# Patient Record
Sex: Female | Born: 1970
Health system: Southern US, Community
[De-identification: ages and names within clinical notes are randomized; demographics above are authoritative.]

## PROBLEM LIST (undated history)

## (undated) DIAGNOSIS — J45909 Unspecified asthma, uncomplicated: Secondary | ICD-10-CM

## (undated) DIAGNOSIS — Z22322 Carrier or suspected carrier of Methicillin resistant Staphylococcus aureus: Secondary | ICD-10-CM

## (undated) DIAGNOSIS — K219 Gastro-esophageal reflux disease without esophagitis: Secondary | ICD-10-CM

## (undated) HISTORY — PX: ABLATION: SHX5711

## (undated) HISTORY — DX: Carrier or suspected carrier of methicillin resistant Staphylococcus aureus: Z22.322

## (undated) HISTORY — PX: TUBAL LIGATION: SHX77

## (undated) HISTORY — DX: Unspecified asthma, uncomplicated: J45.909

## (undated) HISTORY — DX: Gastro-esophageal reflux disease without esophagitis: K21.9

---

## 1998-02-19 ENCOUNTER — Other Ambulatory Visit: Admission: RE | Admit: 1998-02-19 | Discharge: 1998-02-19 | Payer: Self-pay | Admitting: *Deleted

## 1998-03-21 ENCOUNTER — Other Ambulatory Visit: Admission: RE | Admit: 1998-03-21 | Discharge: 1998-03-21 | Payer: Self-pay | Admitting: *Deleted

## 1998-12-23 ENCOUNTER — Encounter: Payer: Self-pay | Admitting: Vascular Surgery

## 1998-12-27 ENCOUNTER — Ambulatory Visit (HOSPITAL_COMMUNITY): Admission: RE | Admit: 1998-12-27 | Discharge: 1998-12-27 | Payer: Self-pay | Admitting: Vascular Surgery

## 1999-03-26 ENCOUNTER — Other Ambulatory Visit: Admission: RE | Admit: 1999-03-26 | Discharge: 1999-03-26 | Payer: Self-pay | Admitting: *Deleted

## 2000-04-21 ENCOUNTER — Other Ambulatory Visit: Admission: RE | Admit: 2000-04-21 | Discharge: 2000-04-21 | Payer: Self-pay | Admitting: *Deleted

## 2000-08-27 ENCOUNTER — Encounter: Admission: RE | Admit: 2000-08-27 | Discharge: 2000-08-27 | Payer: Self-pay | Admitting: *Deleted

## 2000-08-27 ENCOUNTER — Encounter: Payer: Self-pay | Admitting: *Deleted

## 2001-11-24 ENCOUNTER — Other Ambulatory Visit: Admission: RE | Admit: 2001-11-24 | Discharge: 2001-11-24 | Payer: Self-pay | Admitting: Obstetrics and Gynecology

## 2002-01-17 ENCOUNTER — Encounter: Payer: Self-pay | Admitting: Vascular Surgery

## 2002-01-20 ENCOUNTER — Ambulatory Visit (HOSPITAL_COMMUNITY): Admission: RE | Admit: 2002-01-20 | Discharge: 2002-01-20 | Payer: Self-pay | Admitting: Vascular Surgery

## 2002-01-20 ENCOUNTER — Encounter (INDEPENDENT_AMBULATORY_CARE_PROVIDER_SITE_OTHER): Payer: Self-pay | Admitting: *Deleted

## 2003-05-01 ENCOUNTER — Other Ambulatory Visit: Admission: RE | Admit: 2003-05-01 | Discharge: 2003-05-01 | Payer: Self-pay | Admitting: *Deleted

## 2004-05-26 ENCOUNTER — Other Ambulatory Visit: Admission: RE | Admit: 2004-05-26 | Discharge: 2004-05-26 | Payer: Self-pay | Admitting: Obstetrics and Gynecology

## 2005-07-27 ENCOUNTER — Other Ambulatory Visit: Admission: RE | Admit: 2005-07-27 | Discharge: 2005-07-27 | Payer: Self-pay | Admitting: Obstetrics and Gynecology

## 2007-01-12 ENCOUNTER — Ambulatory Visit (HOSPITAL_BASED_OUTPATIENT_CLINIC_OR_DEPARTMENT_OTHER): Admission: RE | Admit: 2007-01-12 | Discharge: 2007-01-12 | Payer: Self-pay | Admitting: Urology

## 2007-01-12 ENCOUNTER — Encounter (INDEPENDENT_AMBULATORY_CARE_PROVIDER_SITE_OTHER): Payer: Self-pay | Admitting: *Deleted

## 2007-06-07 ENCOUNTER — Encounter: Admission: RE | Admit: 2007-06-07 | Discharge: 2007-06-07 | Payer: Self-pay | Admitting: Obstetrics and Gynecology

## 2007-09-01 ENCOUNTER — Ambulatory Visit (HOSPITAL_COMMUNITY): Admission: RE | Admit: 2007-09-01 | Discharge: 2007-09-01 | Payer: Self-pay | Admitting: Obstetrics and Gynecology

## 2007-09-01 ENCOUNTER — Encounter (INDEPENDENT_AMBULATORY_CARE_PROVIDER_SITE_OTHER): Payer: Self-pay | Admitting: Obstetrics and Gynecology

## 2011-02-10 NOTE — Op Note (Signed)
NAME:  Kelsey Sellers, Kelsey Sellers             ACCOUNT NO.:  [id number]   MEDICAL RECORD NO.:  [id number]          PATIENT TYPE:  AMB   LOCATION:  SDC                           FACILITY:  [facility]   PHYSICIAN:  Michelle L. Grewal, M.D.DATE OF BIRTH:  1971-04-10   DATE OF PROCEDURE:  09/01/2007  DATE OF DISCHARGE:                               OPERATIVE REPORT   PREOPERATIVE DIAGNOSIS:  Menorrhagia and congenital hypertrophy of  bilateral labia.   POSTOPERATIVE DIAGNOSIS:  Menorrhagia and congenital hypertrophy of  bilateral labia.   PROCEDURE:  D&C, hysteroscopy, ThermaChoice endometrial ablation, and  bilateral labial reduction.   SURGEON:  Michelle L. Vincente Poli, M.D.   ANESTHESIA:  MAC with local.   FINDINGS:  Bilaterally enlarged labia.   SPECIMENS:  Pathology - uterine curetting.   ESTIMATED BLOOD LOSS:  Minimal.   COMPLICATIONS:  None.   DESCRIPTION OF PROCEDURE:  The patient was taken to the operating room.  Her anesthesia was administered.  She was prepped and draped in the  usual sterile fashion.  In-and-out catheter was emptied.  Speculum was  inserted into the vagina.  The cervix was grasped with a tenaculum.  The  cervical internal os was gently dilated using Pratt dilators.  The  hysteroscope was inserted into the uterine cavity and excellent  visualization noted a normal cavity.  The hysteroscope was removed.  A  uterine curet was inserted and a sharp uterine curettage was performed.  All curettings were sent to pathology.  We then inserted a Thermachoice-  3 balloon and performed an endometrial ablation for eight minutes  according to the manufacturer's specifications.  The intact balloon was  removed after the 8-minute cycle.  All instruments were removed from the  vagina.  We then turned our attention to the labor.  Examination under  anesthesia revealed both of her labia were quite enlarged and the edges  of the skin from 3 o'clock all the way around to 9 o'clock were  thickened and hyperpigmented consistent with chronic irritation.  I then  used a 15 blade and basically made a linear incision down from 3 o'clock  down to the posterior fourchette and did this likewise on both sides and  removed all redundant skin.  We then closed using a subcuticular using 3-  0 Vicryl  and nicely reapproximated the skin.  At the end of this, hemostasis was  excellent.  She also had very symmetric labia that was flush with her  skin.  Estrace cream was applied to the incision.  The patient will be  given an ice pack.  She was then taken to PACU in stable condition.  All  needle, sponge, and instrument counts correct x2.      Michelle L. Vincente Poli, M.D.  Electronically Signed     MLG/MEDQ  D:  09/01/2007  T:  09/01/2007  Job:  [id number]

## 2011-07-06 LAB — CBC
HCT: 42.7
Hemoglobin: 14.7
MCHC: 34.5
MCV: 96.4
Platelets: 266
RBC: 4.43
RDW: 12.8
WBC: 8.5

## 2011-07-06 LAB — PREGNANCY, URINE: Preg Test, Ur: NEGATIVE

## 2011-10-17 ENCOUNTER — Encounter (HOSPITAL_COMMUNITY): Payer: Self-pay | Admitting: Pharmacist

## 2011-10-26 ENCOUNTER — Other Ambulatory Visit (HOSPITAL_COMMUNITY): Payer: Self-pay

## 2011-10-27 ENCOUNTER — Other Ambulatory Visit: Payer: Self-pay | Admitting: Emergency Medicine

## 2011-10-27 ENCOUNTER — Ambulatory Visit
Admission: RE | Admit: 2011-10-27 | Discharge: 2011-10-27 | Disposition: A | Discharge status: Home / Self Care | Payer: Managed Care, Other (non HMO) | Source: Ambulatory Visit | Attending: Emergency Medicine | Admitting: Emergency Medicine

## 2011-10-27 DIAGNOSIS — R05 Cough: Secondary | ICD-10-CM

## 2011-10-29 ENCOUNTER — Encounter (HOSPITAL_COMMUNITY): Admission: RE | Payer: Self-pay | Source: Ambulatory Visit

## 2011-10-29 ENCOUNTER — Ambulatory Visit (HOSPITAL_COMMUNITY): Admission: RE | Admit: 2011-10-29 | Payer: Self-pay | Source: Ambulatory Visit | Admitting: Obstetrics and Gynecology

## 2011-10-29 SURGERY — LABIAPLASTY, VULVA
Anesthesia: Choice | Laterality: Bilateral

## 2012-02-18 ENCOUNTER — Ambulatory Visit (HOSPITAL_COMMUNITY)
Admission: RE | Admit: 2012-02-18 | Discharge: 2012-02-18 | Disposition: A | Discharge status: Home / Self Care | Payer: Managed Care, Other (non HMO) | Source: Ambulatory Visit | Attending: Obstetrics and Gynecology | Admitting: Obstetrics and Gynecology

## 2012-02-18 ENCOUNTER — Other Ambulatory Visit (HOSPITAL_COMMUNITY): Payer: Self-pay | Admitting: Obstetrics and Gynecology

## 2012-02-18 DIAGNOSIS — R109 Unspecified abdominal pain: Secondary | ICD-10-CM

## 2012-02-18 MED ORDER — IOHEXOL 300 MG/ML  SOLN
100.0000 mL | Freq: Once | INTRAMUSCULAR | Status: AC | PRN
  Administered 2012-02-18: 100 mL via INTRAVENOUS

## 2013-07-03 ENCOUNTER — Other Ambulatory Visit: Payer: Self-pay | Admitting: Obstetrics and Gynecology

## 2014-05-23 ENCOUNTER — Other Ambulatory Visit: Payer: Self-pay | Admitting: Family Medicine

## 2014-05-23 ENCOUNTER — Ambulatory Visit
Admission: RE | Admit: 2014-05-23 | Discharge: 2014-05-23 | Disposition: A | Discharge status: Home / Self Care | Payer: Managed Care, Other (non HMO) | Source: Ambulatory Visit | Attending: Family Medicine | Admitting: Family Medicine

## 2014-05-23 DIAGNOSIS — R9389 Abnormal findings on diagnostic imaging of other specified body structures: Secondary | ICD-10-CM

## 2014-05-23 DIAGNOSIS — R0602 Shortness of breath: Secondary | ICD-10-CM

## 2014-11-29 ENCOUNTER — Other Ambulatory Visit: Payer: Self-pay | Admitting: Obstetrics and Gynecology

## 2014-11-30 LAB — CYTOLOGY - PAP

## 2014-12-04 ENCOUNTER — Other Ambulatory Visit: Payer: Self-pay | Admitting: Obstetrics and Gynecology

## 2014-12-04 DIAGNOSIS — R928 Other abnormal and inconclusive findings on diagnostic imaging of breast: Secondary | ICD-10-CM

## 2014-12-10 ENCOUNTER — Ambulatory Visit
Admission: RE | Admit: 2014-12-10 | Discharge: 2014-12-10 | Disposition: A | Discharge status: Home / Self Care | Payer: Managed Care, Other (non HMO) | Source: Ambulatory Visit | Attending: Obstetrics and Gynecology | Admitting: Obstetrics and Gynecology

## 2014-12-10 DIAGNOSIS — R928 Other abnormal and inconclusive findings on diagnostic imaging of breast: Secondary | ICD-10-CM

## 2015-09-03 ENCOUNTER — Encounter: Payer: Self-pay | Admitting: Pulmonary Disease

## 2015-09-03 ENCOUNTER — Ambulatory Visit (INDEPENDENT_AMBULATORY_CARE_PROVIDER_SITE_OTHER): Payer: Managed Care, Other (non HMO) | Admitting: Pulmonary Disease

## 2015-09-03 VITALS — BP 118/64 | HR 53 | Ht 64.0 in | Wt 134.6 lb

## 2015-09-03 DIAGNOSIS — R0600 Dyspnea, unspecified: Secondary | ICD-10-CM | POA: Diagnosis not present

## 2015-09-03 NOTE — Patient Instructions (Signed)
We will schedule you for lung function tests and a methacholine challenge test He is using the ventolin inhaler as prescribed  Return to clinic after these tests in 1 month

## 2015-09-03 NOTE — Progress Notes (Signed)
   Subjective:    Patient ID: Kelsey Sellers, female    DOB: 07/28/1971, 44 y.o.   MRN: [id number]  HPI Consult for evaluation of asthma.  Kelsey Sellers is a 44 year old with past medical history of asthma, allergies. She has episodes of bronchitis about once every year. She was diagnosed with asthma by her primary care physician and was given albuterol inhaler.she has history of dyspnea on exertion. She gets short of breath on climbing 2-3 flights of stairs. She may does maintain an active lifestyle with regular exercise and walking. Her shortness of breath isng. She does not have any cough or sputum production. She has allergic symptoms throughout the year with rhinitis and postnasal drip. She was given a PPI for acid reflux but denies any symptoms of heartburn.She is given albuterol inhaler by her primary care physician. She uses it about 2-3 times a week. She says that it does relieve her symptoms of chest tightness and dyspnea.  She was sick a few months ago while traveling back from [place]. She was seen at urgent care and given a diagnosis of diverticulitis. She was treated with levofloxacin and Flagyl.She had a chest x-ray at that time. I reviewed these images. There is no evidence of any consolidation or infiltrate. However there is a suggestion of hyper inflation.  She is an ex-smoker. Quit when she was 44 years old. 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  Past Medical History  Diagnosis Date  . Asthma     Current outpatient prescriptions:  .  albuterol (PROVENTIL HFA;VENTOLIN HFA) 108 (90 BASE) MCG/ACT inhaler, Inhale 1 puff into the lungs every 6 (six) hours as needed for wheezing or shortness of breath., Disp: , Rfl:  .  cetirizine (ZYRTEC ALLERGY) 10 MG tablet, Take 10 mg by mouth daily., Disp: , Rfl:   Review of Systems Dyspnea on exertion, occasional wheezing. No cough, sputum production, hemoptysis. Denies any  fevers, chills,malaise, fatigue, loss of weight or appetite. Denies any chest pain, palpitations. Denies any nausea, vomiting, diarrhea, constipation. All other review of systems are negative     Objective:   Physical Exam  Blood pressure 118/64, pulse 53, height 5\' 4"  (1.626 m), weight 134 lb 9.6 oz (61.054 kg), SpO2 99 %. Gen: No apparent distress Neuro: No gross focal deficits. Neck: No JVD, lymphadenopathy, thyromegaly. RS: Clear, no wheeze or crackles CVS: S1-S2 heard, no murmurs rubs gallops. Abdomen: Soft, positive bowel sounds. Extremities: No edema.    Assessment & Plan:  Dyspnea on exertion with wheezing  Symptoms are consistent with mild persistent asthma. She does respond to albuterol inhaler and has allergic symptoms.I asked her to continue the albuterol inhaler as needed. I will also get a of PFTs with bronchodilator response and a methacholine challenge test for further evaluation. She may need to be on an additional controller medication.  Chilton GreathousePraveen Molly Maselli MD [facility] Pager [phone] If no answer or after 3pm call: [phone] 09/03/2015, 5:16 PM

## 2015-09-26 ENCOUNTER — Encounter (HOSPITAL_COMMUNITY): Payer: Managed Care, Other (non HMO)

## 2015-09-27 ENCOUNTER — Ambulatory Visit (HOSPITAL_COMMUNITY)
Admission: RE | Admit: 2015-09-27 | Discharge: 2015-09-27 | Disposition: A | Discharge status: Home / Self Care | Payer: Managed Care, Other (non HMO) | Source: Ambulatory Visit | Attending: Pulmonary Disease | Admitting: Pulmonary Disease

## 2015-09-27 DIAGNOSIS — R0600 Dyspnea, unspecified: Secondary | ICD-10-CM | POA: Diagnosis present

## 2015-09-27 LAB — PULMONARY FUNCTION TEST
FEF 25-75 PRE: 3.07 L/s
FEF 25-75 Post: 1.57 L/sec
FEF2575-%Change-Post: -48 %
FEF2575-%PRED-POST: 52 %
FEF2575-%Pred-Pre: 101 %
FEV1-%CHANGE-POST: -33 %
FEV1-%Pred-Post: 67 %
FEV1-%Pred-Pre: 102 %
FEV1-POST: 2.01 L
FEV1-PRE: 3.02 L
FEV1FVC-%Change-Post: -28 %
FEV1FVC-%PRED-PRE: 99 %
FEV6-%Change-Post: -7 %
FEV6-%PRED-POST: 96 %
FEV6-%PRED-PRE: 103 %
FEV6-POST: 3.44 L
FEV6-PRE: 3.7 L
FEV6FVC-%PRED-POST: 102 %
FEV6FVC-%PRED-PRE: 102 %
FVC-%CHANGE-POST: -7 %
FVC-%PRED-PRE: 101 %
FVC-%Pred-Post: 93 %
FVC-PRE: 3.7 L
FVC-Post: 3.44 L
POST FEV6/FVC RATIO: 100 %
PRE FEV6/FVC RATIO: 100 %
Post FEV1/FVC ratio: 59 %
Pre FEV1/FVC ratio: 82 %

## 2015-09-27 MED ORDER — METHACHOLINE 0.0625 MG/ML NEB SOLN
2.0000 mL | Freq: Once | RESPIRATORY_TRACT | Status: AC
  Administered 2015-09-27: 0.125 mg via RESPIRATORY_TRACT

## 2015-09-27 MED ORDER — METHACHOLINE 4 MG/ML NEB SOLN: 2.0000 mL | Freq: Once | RESPIRATORY_TRACT | Status: DC

## 2015-09-27 MED ORDER — ALBUTEROL SULFATE (2.5 MG/3ML) 0.083% IN NEBU
2.5000 mg | INHALATION_SOLUTION | Freq: Once | RESPIRATORY_TRACT | Status: AC
  Administered 2015-09-27: 2.5 mg via RESPIRATORY_TRACT

## 2015-09-27 MED ORDER — SODIUM CHLORIDE 0.9 % IN NEBU
3.0000 mL | INHALATION_SOLUTION | Freq: Once | RESPIRATORY_TRACT | Status: AC
  Administered 2015-09-27: 3 mL via RESPIRATORY_TRACT

## 2015-09-27 MED ORDER — METHACHOLINE 1 MG/ML NEB SOLN: 2.0000 mL | Freq: Once | RESPIRATORY_TRACT | Status: DC

## 2015-09-27 MED ORDER — METHACHOLINE 0.25 MG/ML NEB SOLN
2.0000 mL | Freq: Once | RESPIRATORY_TRACT | Status: AC
  Administered 2015-09-27: 0.5 mg via RESPIRATORY_TRACT

## 2015-09-27 MED ORDER — METHACHOLINE 16 MG/ML NEB SOLN: 2.0000 mL | Freq: Once | RESPIRATORY_TRACT | Status: DC

## 2015-10-01 ENCOUNTER — Ambulatory Visit (INDEPENDENT_AMBULATORY_CARE_PROVIDER_SITE_OTHER): Payer: Managed Care, Other (non HMO) | Admitting: Adult Health

## 2015-10-01 ENCOUNTER — Telehealth: Payer: Self-pay | Admitting: Pulmonary Disease

## 2015-10-01 ENCOUNTER — Encounter: Payer: Self-pay | Admitting: Adult Health

## 2015-10-01 ENCOUNTER — Ambulatory Visit (INDEPENDENT_AMBULATORY_CARE_PROVIDER_SITE_OTHER)
Admission: RE | Admit: 2015-10-01 | Discharge: 2015-10-01 | Disposition: A | Discharge status: Home / Self Care | Payer: Managed Care, Other (non HMO) | Source: Ambulatory Visit | Attending: Adult Health | Admitting: Adult Health

## 2015-10-01 VITALS — BP 102/80 | HR 71 | Temp 98.3°F | Ht 64.0 in | Wt 138.0 lb

## 2015-10-01 DIAGNOSIS — R0989 Other specified symptoms and signs involving the circulatory and respiratory systems: Secondary | ICD-10-CM

## 2015-10-01 DIAGNOSIS — J45901 Unspecified asthma with (acute) exacerbation: Secondary | ICD-10-CM | POA: Insufficient documentation

## 2015-10-01 MED ORDER — PREDNISONE 10 MG PO TABS: ORAL_TABLET | ORAL | Status: DC

## 2015-10-01 MED ORDER — LEVALBUTEROL HCL 0.63 MG/3ML IN NEBU
0.6300 mg | INHALATION_SOLUTION | Freq: Once | RESPIRATORY_TRACT | Status: AC
  Administered 2015-10-01: 0.63 mg via RESPIRATORY_TRACT

## 2015-10-01 NOTE — Progress Notes (Signed)
Quick Note:  Called and spoke with pt. Reviewed results and recs. Pt voiced understanding and had no further questions. ______ 

## 2015-10-01 NOTE — Telephone Encounter (Signed)
error 

## 2015-10-01 NOTE — Progress Notes (Signed)
   Subjective:    Patient ID: Kelsey Sellers, female    DOB: [birth year], 45 y.o.   MRN: [id number]  HPI 45 yo female former smoker seen for pulmonary consult 08/2015 .   10/01/2015 Acute OV  Pt complains of 3 days of chest congestion/tightness, occasional dry cough, sinus congestion, PND, sneezing, nausea and wheezing .  Denies any fever or vomiting, discolored mucus .  Says she has methacholine challenge test on 12/30 , sx started right after that.  She denies any chest pain, orthopnea, PND, leg swelling or orthopnea. Methacholine challenge test results are not available. Has an upcoming pulmonary function test next week.  Was sick over Thanksgiving tx for bronchitis tx w/ several abx  . Took her few weeks to get better. Cough did go entirely away until last 3 days.    Past Medical History  Diagnosis Date  . Asthma    Current Outpatient Prescriptions on File Prior to Visit  Medication Sig Dispense Refill  . albuterol (PROVENTIL HFA;VENTOLIN HFA) 108 (90 BASE) MCG/ACT inhaler Inhale 1 puff into the lungs every 6 (six) hours as needed for wheezing or shortness of breath.    . cetirizine (ZYRTEC ALLERGY) 10 MG tablet Take 10 mg by mouth daily.     No current facility-administered medications on file prior to visit.      Review of Systems Constitutional:   No  weight loss, night sweats,  Fevers, chills, fatigue, or  lassitude.  HEENT:   No headaches,  Difficulty swallowing,  Tooth/dental problems, or  Sore throat,                No sneezing, itching, ear ache,  +nasal congestion, post nasal drip,   CV:  No chest pain,  Orthopnea, PND, swelling in lower extremities, anasarca, dizziness, palpitations, syncope.   GI  No heartburn, indigestion, abdominal pain, nausea, vomiting, diarrhea, change in bowel habits, loss of appetite, bloody stools.   Resp:    No chest wall deformity  Skin: no rash or lesions.  GU: no dysuria, change in color of urine, no urgency or frequency.  No flank  pain, no hematuria   MS:  No joint pain or swelling.  No decreased range of motion.  No back pain.  Psych:  No change in mood or affect. No depression or anxiety.  No memory loss.         Objective:   Physical Exam Filed Vitals:   10/01/15 1036  BP: 102/80  Pulse: 71  Temp: 98.3 F (36.8 C)  TempSrc: Oral  Height: 5\' 4"  (1.626 m)  Weight: 138 lb (62.596 kg)  SpO2: 100%   GEN: A/Ox3; pleasant , NAD, well nourished   HEENT:  [place]/AT,  EACs-clear, TMs-wnl, NOSE-clear drainage THROAT-clear, no lesions, no postnasal drip or exudate noted.   NECK:  Supple w/ fair ROM; no JVD; normal carotid impulses w/o bruits; no thyromegaly or nodules palpated; no lymphadenopathy.  RESP  Clear  P & A; w/o, wheezes/ rales/ or rhonchi.no accessory muscle use, no dullness to percussion  CARD:  RRR, no m/r/g  , no peripheral edema, pulses intact, no cyanosis or clubbing.  GI:   Soft & nt; nml bowel sounds; no organomegaly or masses detected.  Musco: Warm bil, no deformities or joint swelling noted.   Neuro: alert, no focal deficits noted.    Skin: Warm, no lesions or rashes         Assessment & Plan:

## 2015-10-01 NOTE — Patient Instructions (Addendum)
Prednisone taper over next week.  Mucinex DM Twice daily As needed  Cough/congestion .  Saline nasal rinses As needed   Chest xray today  Follow up Dr. Isaiah SergeMannam next week as planned and As needed   Please contact office for sooner follow up if symptoms do not improve or worsen or seek emergency care

## 2015-10-01 NOTE — Assessment & Plan Note (Signed)
Flare w/ URI suspect is viral +/- aggravation from MCT  Hold on abx at this time  Check cxr today   Plan  Prednisone taper over next week.  Mucinex DM Twice daily As needed  Cough/congestion .  Saline nasal rinses As needed   Chest xray today  Follow up Dr. Isaiah SergeMannam next week as planned and As needed   Please contact office for sooner follow up if symptoms do not improve or worsen or seek emergency care

## 2015-10-02 ENCOUNTER — Telehealth: Payer: Self-pay | Admitting: Adult Health

## 2015-10-02 MED ORDER — AZITHROMYCIN 250 MG PO TABS: ORAL_TABLET | ORAL | Status: DC

## 2015-10-02 NOTE — Telephone Encounter (Signed)
[facility] [address]. Called and left message advising patient that [facility]. Nothing further needed.

## 2015-10-02 NOTE — Telephone Encounter (Signed)
Spoke with the patient. [name] pressure and discharge is worse and she had fever and chills overnight. Please order Z pack. 500 mg today and then 250 mg for next 4 days.

## 2015-10-02 NOTE — Telephone Encounter (Signed)
Spoke with pt, c/o sinus congestion, teeth pain, facial pain, HA x4 days.   Pt saw TP yesterday-per these recs was given on pred taper, recs to take mucinex and do sinus rinses, which pt states she's been doing.  Pt requesting in addition to her recs from yesterday. [facility] [address]    PM please advise.  Thanks!

## 2015-10-02 NOTE — Telephone Encounter (Signed)
PM is off this afternoon and message has not been answered.  Sending to DOD for recs. MR please advise on recs.  Thanks.

## 2015-10-07 ENCOUNTER — Ambulatory Visit (HOSPITAL_COMMUNITY): Payer: Managed Care, Other (non HMO)

## 2015-10-07 ENCOUNTER — Ambulatory Visit: Payer: Managed Care, Other (non HMO) | Admitting: Pulmonary Disease

## 2016-05-18 ENCOUNTER — Other Ambulatory Visit: Payer: Self-pay | Admitting: Emergency Medicine

## 2016-05-18 DIAGNOSIS — R1032 Left lower quadrant pain: Secondary | ICD-10-CM

## 2016-05-19 ENCOUNTER — Encounter: Payer: Self-pay | Admitting: Radiology

## 2016-05-19 ENCOUNTER — Ambulatory Visit
Admission: RE | Admit: 2016-05-19 | Discharge: 2016-05-19 | Disposition: A | Discharge status: Home / Self Care | Payer: Managed Care, Other (non HMO) | Source: Ambulatory Visit | Attending: Emergency Medicine | Admitting: Emergency Medicine

## 2016-05-19 DIAGNOSIS — R1032 Left lower quadrant pain: Secondary | ICD-10-CM

## 2016-05-19 MED ORDER — IOPAMIDOL (ISOVUE-300) INJECTION 61%
100.0000 mL | Freq: Once | INTRAVENOUS | Status: AC | PRN
  Administered 2016-05-19: 100 mL via INTRAVENOUS

## 2016-05-20 ENCOUNTER — Encounter: Payer: Self-pay | Admitting: Radiology

## 2017-05-04 DIAGNOSIS — R1013 Epigastric pain: Secondary | ICD-10-CM | POA: Diagnosis present

## 2017-05-04 DIAGNOSIS — K219 Gastro-esophageal reflux disease without esophagitis: Secondary | ICD-10-CM | POA: Diagnosis not present

## 2017-05-04 DIAGNOSIS — Z79899 Other long term (current) drug therapy: Secondary | ICD-10-CM | POA: Diagnosis not present

## 2017-05-04 DIAGNOSIS — J45909 Unspecified asthma, uncomplicated: Secondary | ICD-10-CM | POA: Insufficient documentation

## 2017-05-05 ENCOUNTER — Emergency Department (HOSPITAL_COMMUNITY)
Admission: EM | Admit: 2017-05-05 | Discharge: 2017-05-05 | Disposition: A | Discharge status: Home / Self Care | Payer: 59 | Attending: Emergency Medicine | Admitting: Emergency Medicine

## 2017-05-05 ENCOUNTER — Encounter (HOSPITAL_COMMUNITY): Payer: Self-pay | Admitting: *Deleted

## 2017-05-05 DIAGNOSIS — K219 Gastro-esophageal reflux disease without esophagitis: Secondary | ICD-10-CM

## 2017-05-05 LAB — LIPASE, BLOOD: LIPASE: 45 U/L (ref 11–51)

## 2017-05-05 LAB — COMPREHENSIVE METABOLIC PANEL
ALK PHOS: 41 U/L (ref 38–126)
ALT: 18 U/L (ref 14–54)
AST: 16 U/L (ref 15–41)
Albumin: 4.3 g/dL (ref 3.5–5.0)
Anion gap: 8 (ref 5–15)
BUN: 16 mg/dL (ref 6–20)
CALCIUM: 9.3 mg/dL (ref 8.9–10.3)
CHLORIDE: 104 mmol/L (ref 101–111)
CO2: 25 mmol/L (ref 22–32)
CREATININE: 0.8 mg/dL (ref 0.44–1.00)
Glucose, Bld: 91 mg/dL (ref 65–99)
Potassium: 3.9 mmol/L (ref 3.5–5.1)
SODIUM: 137 mmol/L (ref 135–145)
Total Bilirubin: 0.6 mg/dL (ref 0.3–1.2)
Total Protein: 6.9 g/dL (ref 6.5–8.1)

## 2017-05-05 LAB — URINALYSIS, ROUTINE W REFLEX MICROSCOPIC
BILIRUBIN URINE: NEGATIVE
Bacteria, UA: NONE SEEN
GLUCOSE, UA: NEGATIVE mg/dL
KETONES UR: NEGATIVE mg/dL
LEUKOCYTES UA: NEGATIVE
Nitrite: NEGATIVE
PROTEIN: NEGATIVE mg/dL
Specific Gravity, Urine: 1.005 (ref 1.005–1.030)
Squamous Epithelial / LPF: NONE SEEN
WBC, UA: NONE SEEN WBC/hpf (ref 0–5)
pH: 6 (ref 5.0–8.0)

## 2017-05-05 LAB — CBC
HCT: 43.8 % (ref 36.0–46.0)
Hemoglobin: 14.6 g/dL (ref 12.0–15.0)
MCH: 31.5 pg (ref 26.0–34.0)
MCHC: 33.3 g/dL (ref 30.0–36.0)
MCV: 94.6 fL (ref 78.0–100.0)
PLATELETS: 298 10*3/uL (ref 150–400)
RBC: 4.63 MIL/uL (ref 3.87–5.11)
RDW: 12.7 % (ref 11.5–15.5)
WBC: 8.8 10*3/uL (ref 4.0–10.5)

## 2017-05-05 MED ORDER — OMEPRAZOLE 20 MG PO CPDR: 20.0000 mg | DELAYED_RELEASE_CAPSULE | Freq: Two times a day (BID) | ORAL | 0 refills | Status: DC

## 2017-05-05 MED ORDER — PANTOPRAZOLE SODIUM 40 MG PO TBEC
40.0000 mg | DELAYED_RELEASE_TABLET | Freq: Every day | ORAL | Status: DC
  Administered 2017-05-05: 40 mg via ORAL
  Filled 2017-05-05: qty 1

## 2017-05-05 MED ORDER — ONDANSETRON 4 MG PO TBDP: 4.0000 mg | ORAL_TABLET | Freq: Once | ORAL | Status: DC | PRN

## 2017-05-05 MED ORDER — SUCRALFATE 1 GM/10ML PO SUSP: 1.0000 g | Freq: Three times a day (TID) | ORAL | 0 refills | Status: DC

## 2017-05-05 MED ORDER — SUCRALFATE 1 G PO TABS
1.0000 g | ORAL_TABLET | Freq: Once | ORAL | Status: AC
  Administered 2017-05-05: 1 g via ORAL
  Filled 2017-05-05: qty 1

## 2017-05-05 NOTE — ED Triage Notes (Signed)
Pt c/o intermittent epigastric pain x 1 week with worsening pain tonight and nausea.

## 2017-05-05 NOTE — ED Notes (Signed)
Pt took ranidine without improvement

## 2017-05-05 NOTE — Discharge Instructions (Signed)
Today your labs are all within normal parameters.  You've been started on medication for gastric reflux, even given a prescription for Carafate take this.  He for meals and before bedtime on a regular basis for 1 week, then as needed U been given a second prescription for Prilosec.  Take this medication twice a day, once in the morning once an evening for 7 days and then 1 tablet daily.  Please make an appointment with your primary care physician for follow-up in 7-10 days

## 2017-05-05 NOTE — ED Provider Notes (Signed)
[facility]-EMER[facility]ENCY DEPT Provider Note   CSN: [id number] Arrival date & time: 05/04/17  2245     History   Chief Complaint Chief Complaint  Patient presents with  . Abdominal Pain    HPI Kelsey Sellers is a 46 y.o. female.  This a normally healthy 46 year old female who presents with 1+ weeks of intermittent epigastric pain that occurs shortly after eating will wake her from sleep at night, does radiate into mid chest.  Cannot relate specific foods to exacerbate symptoms. Does have a history of gastritis several months ago after use of ibuprofen.      Past Medical History:  Diagnosis Date  . Asthma     Patient Active Problem List   Diagnosis Date Noted  . Asthmatic bronchitis with acute exacerbation 10/01/2015    Past Surgical History:  Procedure Laterality Date  . ABLATION    . TUBAL LIGATION      OB History    No data available       Home Medications    Prior to Admission medications   Medication Sig Start Date End Date Taking? Authorizing Provider  albuterol (PROVENTIL HFA;VENTOLIN HFA) 108 (90 BASE) MCG/ACT inhaler Inhale 1 puff into the lungs every 6 (six) hours as needed for wheezing or shortness of breath.    [provider]  azithromycin (ZITHROMAX) 250 MG tablet Take 2 tablets today, then 1 tablet daily until gone. 10/02/15   Mannam, Colbert CoyerPraveen, MD  cetirizine (ZYRTEC ALLERGY) 10 MG tablet Take 10 mg by mouth daily.    [provider]  cetirizine (ZYRTEC) 10 MG tablet Take 10 mg by mouth daily as needed. Usually 3 times a week    [provider]  omeprazole (PRILOSEC) 20 MG capsule Take 1 capsule (20 mg total) by mouth 2 times daily at 12 noon and 4 pm. Take 2 tablets daily.  1 in the morning and 1 in the evening for 7 days then 1 tablet daily 05/05/17 06/05/17  Earley FavorSchulz, Cylan Borum, NP  predniSONE (DELTASONE) 10 MG tablet 4 tabs for 2 days, then 3 tabs for 2 days, 2 tabs for 2 days, then 1 tab for 2 days, then stop 10/01/15   Parrett, Tammy S, NP    predniSONE (STERAPRED UNI-PAK) 10 MG tablet Take 10 mg by mouth. dosepak take as directed    [provider]  sucralfate (CARAFATE) 1 GM/10ML suspension Take 10 mLs (1 g total) by mouth 4 (four) times daily -  with meals and at bedtime. Use the Carafate on a regular basis for 1 week, then as needed 05/05/17   Earley FavorSchulz, Nafeesa Dils, NP    Family History Family History  Problem Relation Age of Onset  . Liver disease Mother   . Lung cancer Maternal Grandmother   . Diabetes Maternal Grandmother   . Lung cancer Maternal Grandfather     Social History Social History  Substance Use Topics  . Smoking status: Never Smoker  . Smokeless tobacco: Never Used  . Alcohol use 0.0 oz/week     Comment: rare     Allergies   Iohexol   Review of Systems Review of Systems  Constitutional: Negative for fever.  Respiratory: Negative for shortness of breath.   Cardiovascular: Negative for chest pain.  Gastrointestinal: Positive for abdominal pain. Negative for nausea and vomiting.  All other systems reviewed and are negative.    Physical Exam Updated Vital Signs BP (!) 131/98 (BP Location: Left Arm)   Pulse 72   Temp 98.5 F (  36.9 C) (Oral)   Resp 12   LMP 04/25/2017   SpO2 100%   Physical Exam  Constitutional: She appears well-developed and well-nourished.  HENT:  Head: Normocephalic.  Eyes: Pupils are equal, round, and reactive to light.  Neck: Normal range of motion.  Cardiovascular: Normal rate.   Pulmonary/Chest: Effort normal.  Abdominal: Soft. She exhibits no distension. There is tenderness in the epigastric area.    Skin: Skin is warm.  Psychiatric: She has a normal mood and affect.  Nursing note and vitals reviewed.    ED Treatments / Results  Labs (all labs ordered are listed, but only abnormal results are displayed) Labs Reviewed  URINALYSIS, ROUTINE W REFLEX MICROSCOPIC - Abnormal; Notable for the following:       Result Value   Color, Urine COLORLESS (*)     Hgb urine dipstick SMALL (*)    All other components within normal limits  LIPASE, BLOOD  COMPREHENSIVE METABOLIC PANEL  CBC    EKG  EKG Interpretation None       Radiology No results found.  Procedures Procedures (including critical care time)  Medications Ordered in ED Medications  ondansetron (ZOFRAN-ODT) disintegrating tablet 4 mg (not administered)  pantoprazole (PROTONIX) EC tablet 40 mg (not administered)  sucralfate (CARAFATE) tablet 1 g (1 g Oral Given 05/05/17 0439)     Initial Impression / Assessment and Plan / ED Course  I have reviewed the triage vital signs and the nursing notes.  Pertinent labs & imaging results that were available during my care of the patient were reviewed by me and considered in my medical decision making (see chart for details).      Labs reviewed lipase and LFTs within normal parameters.  Gallbladder disease less likely.  Then, GERD or gastritis.  We'll start patient on Carafate and Prilosec have her follow-up with her PCP.  Final Clinical Impressions(s) / ED Diagnoses   Final diagnoses:  Gastroesophageal reflux disease without esophagitis    New Prescriptions New Prescriptions   OMEPRAZOLE (PRILOSEC) 20 MG CAPSULE    Take 1 capsule (20 mg total) by mouth 2 times daily at 12 noon and 4 pm. Take 2 tablets daily.  1 in the morning and 1 in the evening for 7 days then 1 tablet daily   SUCRALFATE (CARAFATE) 1 GM/10ML SUSPENSION    Take 10 mLs (1 g total) by mouth 4 (four) times daily -  with meals and at bedtime. Use the Carafate on a regular basis for 1 week, then as needed     Earley Favor, NP 05/05/17 0504    Ward, Layla Maw, DO 05/05/17 [phone]

## 2017-05-05 NOTE — ED Notes (Signed)
Offered zofran and percocet; pt declined at present

## 2017-05-19 ENCOUNTER — Other Ambulatory Visit: Payer: Self-pay | Admitting: Physician Assistant

## 2017-05-19 DIAGNOSIS — R110 Nausea: Secondary | ICD-10-CM

## 2017-05-19 DIAGNOSIS — R1013 Epigastric pain: Secondary | ICD-10-CM

## 2017-05-26 ENCOUNTER — Ambulatory Visit
Admission: RE | Admit: 2017-05-26 | Discharge: 2017-05-26 | Disposition: A | Discharge status: Home / Self Care | Payer: No Typology Code available for payment source | Source: Ambulatory Visit | Attending: Physician Assistant | Admitting: Physician Assistant

## 2017-05-26 DIAGNOSIS — R1013 Epigastric pain: Secondary | ICD-10-CM

## 2017-05-26 DIAGNOSIS — R110 Nausea: Secondary | ICD-10-CM

## 2017-05-28 ENCOUNTER — Other Ambulatory Visit: Payer: Self-pay | Admitting: Gastroenterology

## 2017-05-28 DIAGNOSIS — R1013 Epigastric pain: Secondary | ICD-10-CM

## 2017-05-28 DIAGNOSIS — K769 Liver disease, unspecified: Secondary | ICD-10-CM

## 2017-06-06 ENCOUNTER — Inpatient Hospital Stay
Admission: RE | Admit: 2017-06-06 | Discharge: 2017-06-06 | Disposition: A | Discharge status: Home / Self Care | Payer: Self-pay | Source: Ambulatory Visit | Attending: Gastroenterology | Admitting: Gastroenterology

## 2017-06-27 ENCOUNTER — Other Ambulatory Visit: Payer: Self-pay

## 2017-06-29 ENCOUNTER — Telehealth: Payer: Self-pay

## 2017-06-29 NOTE — Telephone Encounter (Signed)
LMOM for patient that I'd phoned in her 13-hour prep [facility] [place], and if the label wasn't clear to give me a call.  She is to take Prednisone  PO 07/09/17 @ 2030, 07/10/17 @ 0230 and 0830.  She is to take Benadryl  PO 07/10/17 @ 0830, as well.  Larina Earthly, RN

## 2017-07-10 ENCOUNTER — Inpatient Hospital Stay: Admission: RE | Admit: 2017-07-10 | Payer: Self-pay | Source: Ambulatory Visit

## 2017-07-18 ENCOUNTER — Inpatient Hospital Stay: Admission: RE | Admit: 2017-07-18 | Payer: Self-pay | Source: Ambulatory Visit

## 2017-08-01 ENCOUNTER — Ambulatory Visit
Admission: RE | Admit: 2017-08-01 | Discharge: 2017-08-01 | Disposition: A | Discharge status: Home / Self Care | Payer: Commercial Managed Care - PPO | Source: Ambulatory Visit | Attending: Gastroenterology | Admitting: Gastroenterology

## 2017-08-01 DIAGNOSIS — R1013 Epigastric pain: Secondary | ICD-10-CM

## 2017-08-01 DIAGNOSIS — K769 Liver disease, unspecified: Secondary | ICD-10-CM

## 2017-08-01 MED ORDER — GADOBENATE DIMEGLUMINE 529 MG/ML IV SOLN
13.0000 mL | Freq: Once | INTRAVENOUS | Status: AC | PRN
  Administered 2017-08-01: 13 mL via INTRAVENOUS

## 2017-08-25 ENCOUNTER — Encounter: Payer: Self-pay | Admitting: Family Medicine

## 2017-08-25 ENCOUNTER — Ambulatory Visit: Payer: 59 | Admitting: Family Medicine

## 2017-08-25 VITALS — BP 120/80 | HR 71 | Temp 97.9°F | Wt 137.4 lb

## 2017-08-25 DIAGNOSIS — J0140 Acute pansinusitis, unspecified: Secondary | ICD-10-CM | POA: Diagnosis not present

## 2017-08-25 DIAGNOSIS — K219 Gastro-esophageal reflux disease without esophagitis: Secondary | ICD-10-CM

## 2017-08-25 DIAGNOSIS — J4520 Mild intermittent asthma, uncomplicated: Secondary | ICD-10-CM | POA: Diagnosis not present

## 2017-08-25 HISTORY — DX: Gastro-esophageal reflux disease without esophagitis: K21.9

## 2017-08-25 LAB — POCT INFLUENZA A/B
INFLUENZA A, POC: NEGATIVE
Influenza B, POC: NEGATIVE

## 2017-08-25 MED ORDER — AMOXICILLIN-POT CLAVULANATE 875-125 MG PO TABS: 1.0000 | ORAL_TABLET | Freq: Two times a day (BID) | ORAL | 0 refills | Status: DC

## 2017-08-25 NOTE — Progress Notes (Signed)
Chief Complaint  Patient presents with  . Sinusitis    ear aches, face, pain, dizzy weak, fatigue  . Sore Throat    chills   Subjective:  Kelsey Sellers is a 46 y.o. female who is new to our practice and here with an acute complaint. Reports a 5 day history of  Generalized weakness, fatigue, dizziness, ear pain, facial pain, thick nasal mucous, sore throat, mild cough. States this feels like a sinus infection. States she has had infections in the past. No recent antibiotics. Is not a smoker.   Denies fever, chest pain, palpitations, shortness of breath, abdominal pain, N/V/D, urinary symptoms.    Underlying asthma and allergy history and has been seen by pulmonology. Has not needed her albuterol in months until yesterday. Used it once yesterday.  Asthma diagnosis in 2016 and as a child.  She also reports history of bronchitis.   Has been using Flonase, Mucinex, Theraflu  Takes Zyrtec daily.   History of reflux and not taking any medication for this.   [employer].  [facility] in past and had an EGD that was negative. Found cysts on her liver that are benign. MRI recently for this.   Works at [employer] in [place].   Denies sick contacts.  No other aggravating or relieving factors.  No other c/o.  ROS as in subjective.   Objective: Vitals:   08/25/17 1038  BP: 120/80  Pulse: 71  Temp: 97.9 F (36.6 C)  SpO2: 98%    General appearance: Alert, WD/WN, no distress, mildly ill appearing                             Skin: warm, no rash                           Head: + maxillary sinus tenderness                            Eyes: conjunctiva normal, corneas clear, PERRLA                            Ears: pearly TMs, external ear canals normal                          Nose: septum midline, turbinates swollen, with erythema and thick discharge             Mouth/throat: MMM, tongue normal, mild pharyngeal erythema                           Neck: supple,  no adenopathy, no thyromegaly, nontender                          Heart: RRR, normal S1, S2, no murmurs                         Lungs: CTA bilaterally, no wheezes, rales, or rhonchi      Assessment: Acute non-recurrent pansinusitis - Plan: amoxicillin-clavulanate (AUGMENTIN) 875-125 MG tablet, Influenza A/B  Mild intermittent asthma without complication   Plan: Flu swab negative.  Discussed diagnosis and treatment of acute sinusitis. Augmentin prescribed (states Amoxil does not usually work for  her) Suggested symptomatic OTC remedies.Nasal saline spray for congestion.  Tylenol or Ibuprofen OTC for fever and malaise. Use albuterol inhaler as needed while sick. Asthma generally well controlled. She is not having an acute exacerbation.   Call/return if not back to baseline after completing the antibiotic.

## 2017-08-31 ENCOUNTER — Telehealth: Payer: Self-pay | Admitting: Family Medicine

## 2017-08-31 NOTE — Telephone Encounter (Signed)
[facility]

## 2017-11-25 ENCOUNTER — Ambulatory Visit: Payer: Self-pay | Admitting: Family Medicine

## 2017-12-01 ENCOUNTER — Ambulatory Visit: Payer: 59 | Admitting: Family Medicine

## 2017-12-07 IMAGING — MR MR ABDOMEN WO/W CM
19 of 20 series · 47 of 48 positions shown · IV contrast (13ml multihance)
Comparison: Abdominal ultrasound of 05/26/2017; abdomen CT from
05/19/2016

CLINICAL DATA: Nausea and vomiting with bloating for 1 month. Right
lower abdominal pain.

EXAM:
MRI ABDOMEN WITHOUT AND WITH CONTRAST
TECHNIQUE: Multiplanar multisequence MR imaging of the abdomen was performed
both before and after the administration of intravenous contrast.
CONTRAST:  13mL MULTIHANCE GADOBENATE DIMEGLUMINE 529 MG/ML IV SOLN

[Series 3: T2 · coronal · 5.0mm · 1.56mm/px · 2 of 35 slices shown (1 of 4)]
[im 1/35]
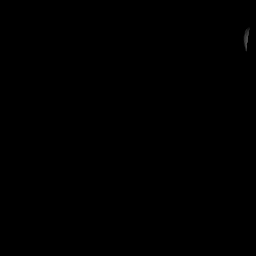
[im 35/35]
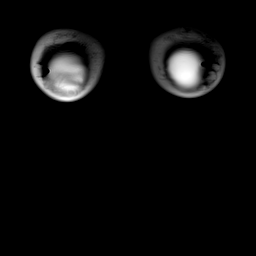

[Series 4: T1 · axial · 3.0mm · 1.19mm/px · z∈[-151,+86]mm · 5 of 160 slices shown]
[im 1/160]
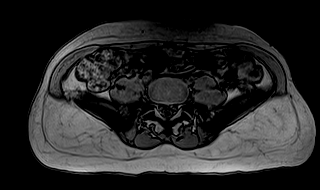
[im 40/160]
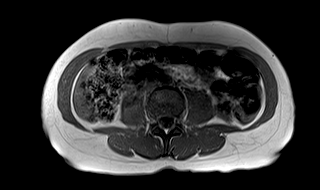
[im 80/160]
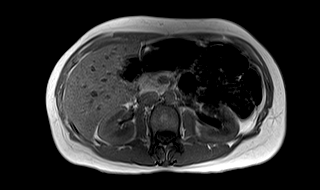
[im 120/160]
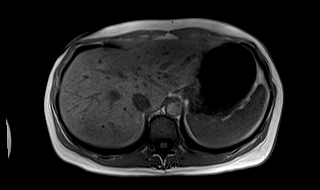
[im 160/160]
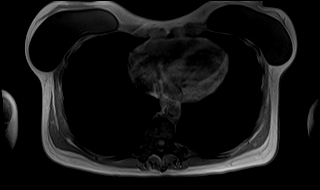

[Series 5: T2 · axial · 5.0mm · 1.48mm/px · 1 of 39 slices shown (2 of 4)]
[im 1/39]
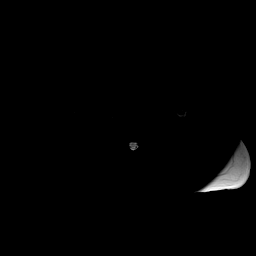

[Series 6: DWI · axial · 5.0mm · 1.42mm/px · z∈[-131,+103]mm · 4 of 120 slices shown (1 of 2)]
[im 1/120]
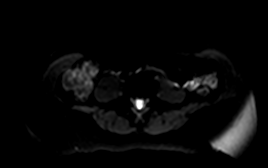
[im 40/120]
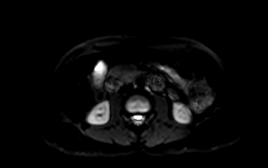
[im 80/120]
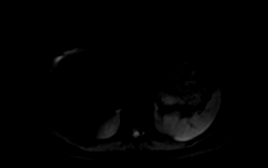
[im 120/120]
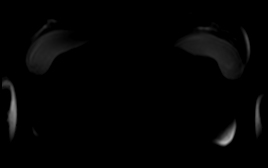

[Series 7: DWI · axial · 5.0mm · 1.42mm/px · 1 of 40 slices shown (2 of 2)]
[im 1/40]
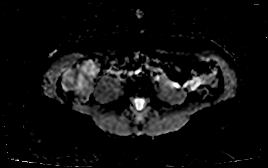

[Series 8: T2 · axial · 6.0mm · 1.19mm/px · 1 of 32 slices shown (3 of 4)]
[im 1/32]
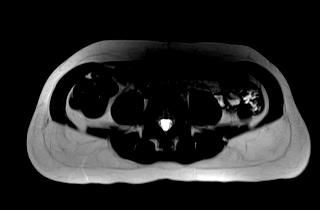

[Series 9: bSSFP · axial · 6.0mm · 1.19mm/px · 1 of 34 slices shown]
[im 1/34]
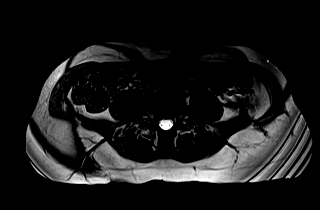

[Series 10: T1 dynamic · axial · non-contrast · 3.0mm · 1.25mm/px · z∈[-163,+98]mm · 3 of 88 slices shown]
[im 1/88]
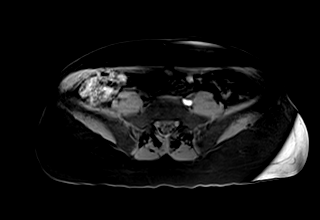
[im 44/88]
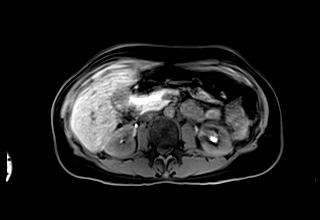
[im 88/88]
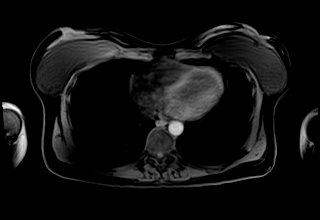

[Series 11: T1 dynamic post-contrast · axial · 3.0mm · 1.25mm/px · z∈[-163,+98]mm · 3 of 88 slices shown (1 of 9)]
[im 1/88]
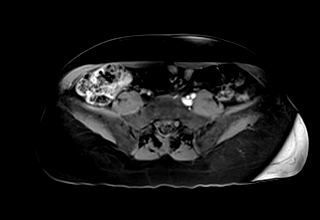
[im 44/88]
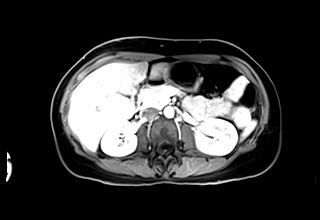
[im 88/88]
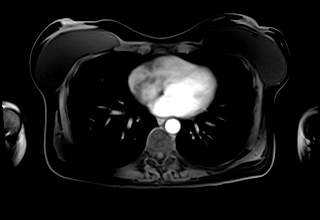

[Series 12: T1 dynamic post-contrast · axial · 3.0mm · 1.25mm/px · z∈[-163,+98]mm · 3 of 88 slices shown (2 of 9)]
[im 1/88]
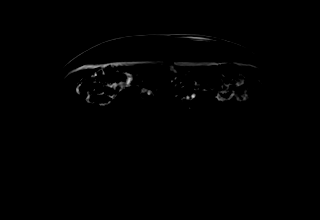
[im 44/88]
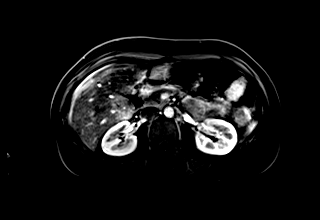
[im 88/88]
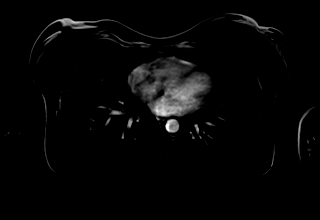

[Series 13: T1 dynamic post-contrast · axial · 3.0mm · 1.25mm/px · z∈[-163,+98]mm · 3 of 88 slices shown (3 of 9)]
[im 1/88]
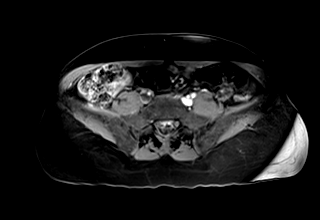
[im 44/88]
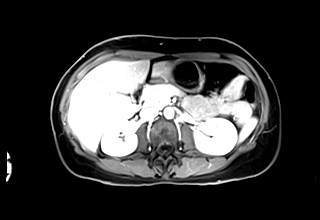
[im 88/88]
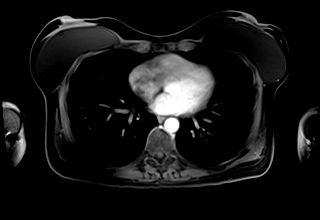

[Series 14: T1 dynamic post-contrast · axial · 3.0mm · 1.25mm/px · z∈[-163,+98]mm · 3 of 88 slices shown (4 of 9)]
[im 1/88]
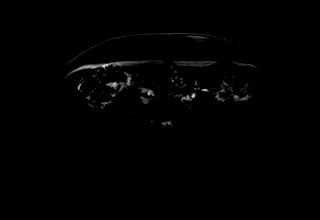
[im 44/88]
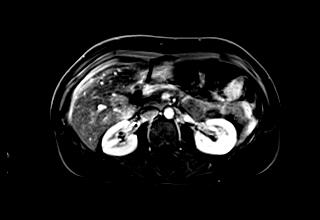
[im 88/88]
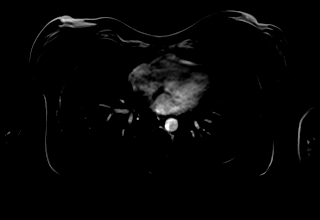

[Series 15: T1 dynamic post-contrast · axial · 3.0mm · 1.25mm/px · z∈[-163,+98]mm · 3 of 88 slices shown (5 of 9)]
[im 1/88]
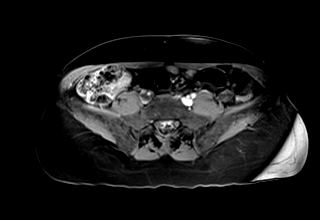
[im 44/88]
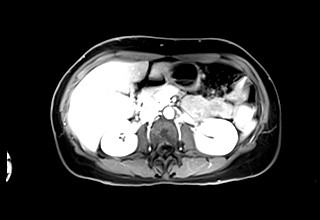
[im 88/88]
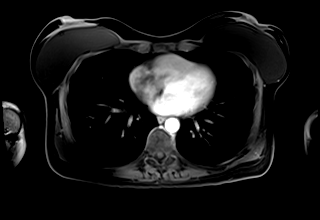

[Series 16: T1 dynamic post-contrast · axial · 3.0mm · 1.25mm/px · z∈[-163,+98]mm · 3 of 88 slices shown (6 of 9)]
[im 1/88]
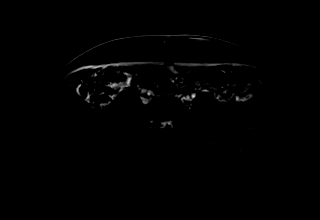
[im 44/88]
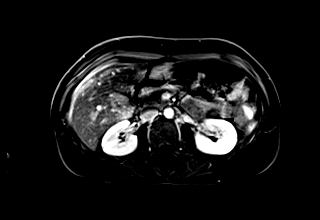
[im 88/88]
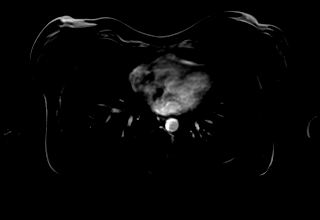

[Series 17: T1 dynamic post-contrast · coronal · 3.0mm · 1.25mm/px · 2 of 72 slices shown (7 of 9)]
[im 1/72]
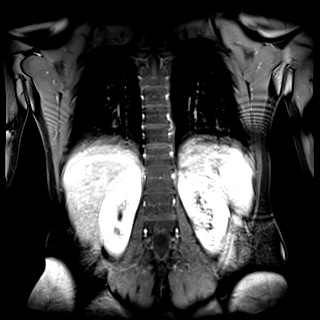
[im 72/72]
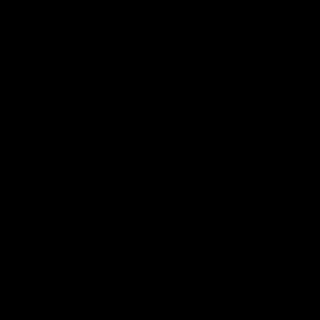

[Series 18: T1 dynamic post-contrast · axial · 3.0mm · 1.25mm/px · z∈[-163,+98]mm · 3 of 88 slices shown (8 of 9)]
[im 1/88]
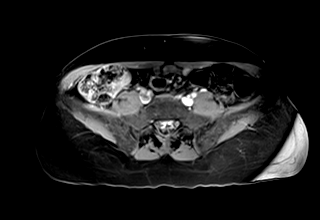
[im 44/88]
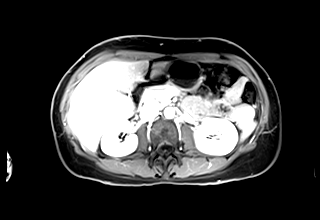
[im 88/88]
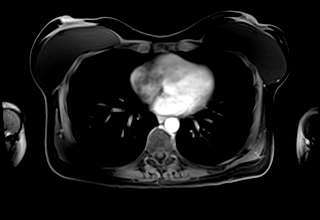

[Series 19: T1 dynamic post-contrast · axial · 3.0mm · 1.25mm/px · z∈[-163,+98]mm · 3 of 88 slices shown (9 of 9)]
[im 1/88]
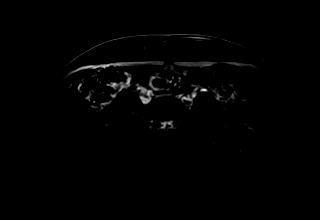
[im 44/88]
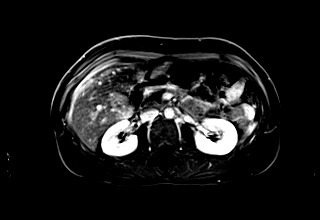
[im 88/88]
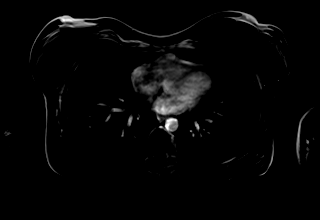

[Series 22: T2 · coronal · 3.0mm · 1.19mm/px · 1 of 19 slices shown (4 of 4)]
[im 1/19]
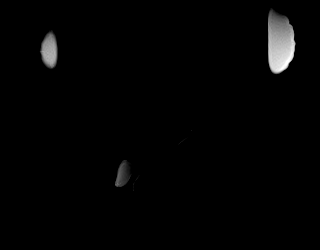

[Series 24: MRCP · coronal · 1.0mm · 0.49mm/px · 2 of 64 slices shown]
[im 1/64]
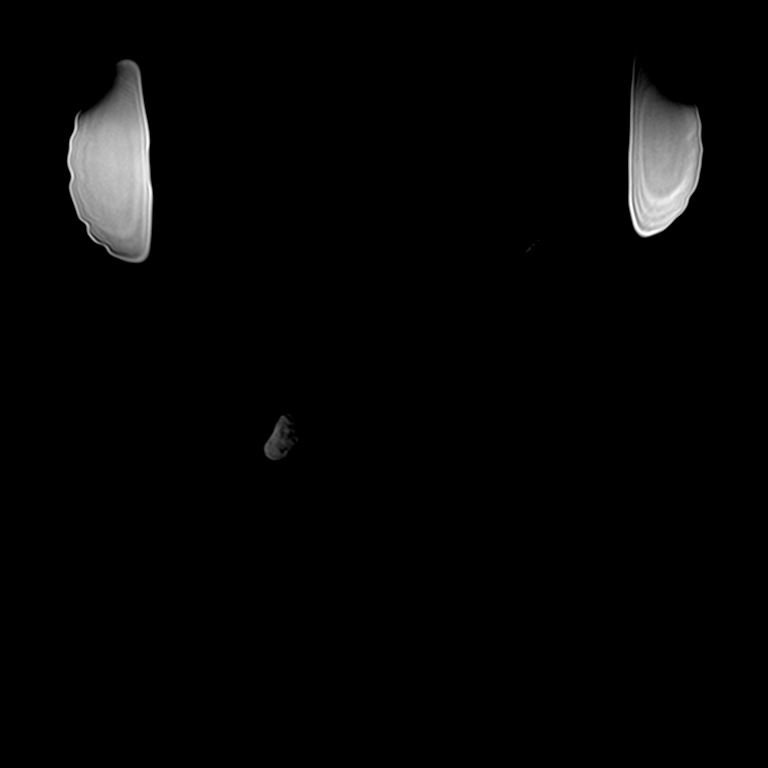
[im 64/64]
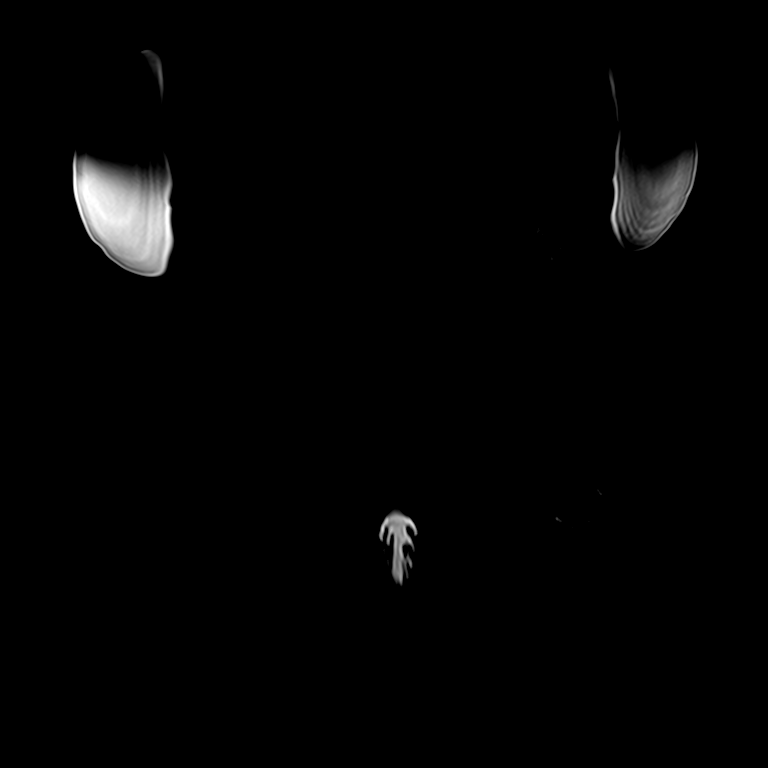

[47 of 48 positions shown; findings below may reference images not displayed]

FINDINGS: Lower chest: Bilateral breast implants noted.

Hepatobiliary: Subtle hepatic steatosis.

A 1.2 by 0.9 cm lesion in segment 3 of the liver is T2 hyperintense
on image [DATE]. Nonspecific enhancement pattern, the precontrast
images are severely degraded by motion artifact and on post-contrast
imaging there is only faint linear accentuated signal centrally in
the lesion but not a pattern specific for hemangioma. This lesion is
reduced in size compared to 1004, and essentially stable from 3130.

A 5 mm lesion laterally in the right hepatic lobe on image [DATE]
appears to demonstrate progressive contrast enhancement typical for
a hemangioma.

A 7 mm T2 hyperintense lesion in the right hepatic lobe on image [DATE]
could be a tiny cyst or less likely a hemangioma based on its
imaging characteristics.

The biliary tree appears unremarkable. Visualized gallbladder
unremarkable.

Pancreas:  Unremarkable

Spleen:  Unremarkable

Adrenals/Urinary Tract:  Unremarkable

Stomach/Bowel: Unremarkable

Vascular/Lymphatic:  Unremarkable

Other:  No supplemental non-categorized findings.

Musculoskeletal: Unremarkable
IMPRESSION: 1. Multiple small T2 signal hyperintensities in the liver. One of
these is a hemangioma and another is likely a cyst. The 1.2 by
cm lesion in segment 3 of the liver is technically nonspecific based
on its imaging characteristics today, although stable from 3130 and
reduced in size from 1004, and hence likely benign.
2. A cause for the patient's symptoms is not identified.

## 2017-12-08 ENCOUNTER — Ambulatory Visit: Payer: 59 | Admitting: Family Medicine

## 2017-12-09 ENCOUNTER — Ambulatory Visit: Payer: 59 | Admitting: Family Medicine

## 2017-12-09 ENCOUNTER — Encounter: Payer: Self-pay | Admitting: Family Medicine

## 2017-12-09 VITALS — BP 102/62 | HR 64 | Temp 98.4°F | Ht 64.0 in | Wt 139.6 lb

## 2017-12-09 DIAGNOSIS — R42 Dizziness and giddiness: Secondary | ICD-10-CM

## 2017-12-09 MED ORDER — MECLIZINE HCL 12.5 MG PO TABS: 12.5000 mg | ORAL_TABLET | Freq: Three times a day (TID) | ORAL | 0 refills | Status: DC | PRN

## 2017-12-09 NOTE — Progress Notes (Signed)
   Subjective:    Patient ID: Kelsey Sellers, female    DOB: April 26, 1971, 47 y.o.   MRN: [id number]  HPI She is here for evaluation of dizziness.  She states that this started approximately 3 weeks ago.  She also complains of this being intermittent in nature and associated slightly with photophobia and phonophobia.  It can last for several hours and then go away.  There is no associated blurred vision, double vision, headache, numbness, tingling, weakness.  Over the last week she has had some difficulty with tinnitus.  Motion does tend to make this worse.  She has no recent history of illnesses.  She does have underlying allergies and is using Zyrtec as well as occasional use of albuterol.   Review of Systems     Objective:   Physical Exam Alert and in no distress.  EOMI.  Other cranial nerves grossly intact.  DTRs normal.  Tympanic membranes and canals are normal. Pharyngeal area is normal. Neck is supple without adenopathy or thyromegaly. Cardiac exam shows a regular sinus rhythm without murmurs or gallops. Lungs are clear to auscultation.        Assessment & Plan:  Dizziness - Plan: meclizine (ANTIVERT) 12.5 MG tablet  I discussed the diagnosis of dizziness with her and explained that there are a lot of options and potential causes of this.  We will start initially with the Antivert and see how she does.  She will keep track of any other symptoms or signs that she might have that might help delineate this better.  She was comfortable with this. At the end of the encounter, she mentioned the possibility of having ADD.  Apparently phentermine taken in the past to help with weight reduction also helped her with focus.  Recommend she set up further consultation concerning that.

## 2018-02-14 ENCOUNTER — Ambulatory Visit: Payer: Self-pay | Admitting: Family Medicine

## 2018-02-14 ENCOUNTER — Encounter: Payer: Self-pay | Admitting: Family Medicine

## 2018-02-14 ENCOUNTER — Ambulatory Visit: Payer: 59 | Admitting: Family Medicine

## 2018-02-14 VITALS — BP 120/68 | HR 71 | Temp 99.1°F | Resp 16 | Ht 64.5 in | Wt 141.8 lb

## 2018-02-14 DIAGNOSIS — L03317 Cellulitis of buttock: Secondary | ICD-10-CM

## 2018-02-14 DIAGNOSIS — L0231 Cutaneous abscess of buttock: Secondary | ICD-10-CM | POA: Diagnosis not present

## 2018-02-14 MED ORDER — SULFAMETHOXAZOLE-TRIMETHOPRIM 800-160 MG PO TABS: 1.0000 | ORAL_TABLET | Freq: Two times a day (BID) | ORAL | 0 refills | Status: DC

## 2018-02-14 NOTE — Progress Notes (Signed)
   Subjective:    Patient ID: Kelsey Sellers, female    DOB: 1971/03/03, 47 y.o.   MRN: [id number]  HPI Chief Complaint  Patient presents with  . staph infection    staph infection- between private and rectal area from shaving. 3 weeks   She is here with complaints of what initially seemed to be an infected hair and then a boil to her left inner buttock for the past 3 weeks. She reports having redness, tenderness, and drainage intermittently for the past 2 weeks.   States 2 weeks ago it "popped" and pus came out. States it popped again this morning in the shower and is currently draining.   Reports having a low grade temperature and nausea. No vomiting or diarrhea.   States she has been using Bactroban. History of MRSA with one abscess and then states she had another abscess last year that was negative for MRSA.   Reviewed allergies, medications, past medical, surgical, family, and social history.     Review of Systems Pertinent positives and negatives in the history of present illness.     Objective:   Physical Exam  Constitutional: She is oriented to person, place, and time. She appears well-developed and well-nourished. She does not have a sickly appearance. No distress.  Cardiovascular: Normal rate, regular rhythm and normal pulses.  Pulmonary/Chest: Effort normal and breath sounds normal.  Genitourinary:     Genitourinary Comments: 1 cm x 1 cm area of induration, no fluctuance.  purulent drainage, surrounding erythema laterally extending  approximately 2 inches.   Neurological: She is alert and oriented to person, place, and time.  Skin: Skin is warm and dry. Capillary refill takes less than 2 seconds.   BP 120/68   Pulse 71   Temp 99.1 F (37.3 C) (Oral)   Resp 16   Ht 5' 4.5" (1.638 m)   Wt 141 lb 12.8 oz (64.3 kg)   SpO2 98%   BMI 23.96 kg/m         Assessment & Plan:  Abscess and cellulitis of gluteal region - Plan: WOUND CULTURE,  sulfamethoxazole-trimethoprim (BACTRIM DS,SEPTRA DS) 800-160 MG tablet  Expelled a significant amount of exudate from the area then cleaned with betadine and alcohol. Bacitracin and gauze applied.  History of MRSA.  Wound culture sent.  Start on Bactrim.  Advised to use warm compresses or sitz baths often. Ibuprofen or Aleve for pain.  Strict precautions that if the redness increases significantly or if she develops high fever or any other signs of deterioration that she will go to the ED. She may also call and be rechecked here tomorrow if she would like otherwise I will see her back in 2 days.

## 2018-02-16 ENCOUNTER — Ambulatory Visit: Payer: Self-pay | Admitting: Family Medicine

## 2018-02-17 ENCOUNTER — Ambulatory Visit: Payer: 59 | Admitting: Family Medicine

## 2018-02-17 ENCOUNTER — Encounter: Payer: Self-pay | Admitting: Family Medicine

## 2018-02-17 VITALS — BP 120/64 | HR 62 | Temp 98.7°F | Resp 16

## 2018-02-17 DIAGNOSIS — Z22322 Carrier or suspected carrier of Methicillin resistant Staphylococcus aureus: Secondary | ICD-10-CM | POA: Insufficient documentation

## 2018-02-17 DIAGNOSIS — L0291 Cutaneous abscess, unspecified: Secondary | ICD-10-CM

## 2018-02-17 LAB — WOUND CULTURE: ORGANISM ID, BACTERIA: NONE SEEN

## 2018-02-17 MED ORDER — MUPIROCIN CALCIUM 2 % EX CREA: 1.0000 "application " | TOPICAL_CREAM | Freq: Two times a day (BID) | CUTANEOUS | 0 refills | Status: DC

## 2018-02-17 NOTE — Progress Notes (Signed)
   Subjective:    Patient ID: Kelsey Sellers, female    DOB: Dec 12, 1970, 47 y.o.   MRN: [id number]  HPI Chief Complaint  Patient presents with  . follow-up    follow-up. doing better, itches and still draining   She is here to follow up on abscess and cellulitis to her left perineal area that started after questionable infected hair bump from shaving. She was seen 3 days ago by me and wound culture was positive for MRSA. Started on Bactrim and she is here for a recheck. Reports symptoms are improving. She still reports fatigue but otherwise feels better.  Denies fever, chills, pain, N/V/D.  Denies recurrent abscesses. States she has had 3 over the past several years. Last one was one year ago.  No new concerns.   Reviewed allergies, medications, past medical, surgical, family, and social history.    Review of Systems Pertinent positives and negatives in the history of present illness.     Objective:   Physical Exam BP 120/64   Pulse 62   Temp 98.7 F (37.1 C) (Oral)   Resp 16   SpO2 99%  Alert and oriented and in no acute distress. Area of induration is much smaller 0.5 cm x 0.5 cm and no longer draining, non tender. No surrounding erythema.       Assessment & Plan:  Abscess - Plan: mupirocin cream (BACTROBAN) 2 %  MRSA (methicillin resistant staph aureus) culture positive - Plan: mupirocin cream (BACTROBAN) 2 %  Discussed that she no longer has cellulitis and the wound appears to be healing quite well. No longer having drainage. Doing fine on Bactrim. Using warm compresses. Will prescribed Bactroban and have her use this for this wound and any others that may develop. She will try using Lever 2000 or Dial soap some days per week to help calm down bacteria.  Follow up if symptoms worsen.

## 2018-02-17 NOTE — Patient Instructions (Signed)
Try using Lever 2000 or Dial soap a couple of times per week.

## 2018-03-18 ENCOUNTER — Other Ambulatory Visit: Payer: Self-pay | Admitting: Family Medicine

## 2018-03-18 ENCOUNTER — Telehealth: Payer: Self-pay

## 2018-03-18 DIAGNOSIS — L03317 Cellulitis of buttock: Principal | ICD-10-CM

## 2018-03-18 DIAGNOSIS — L0231 Cutaneous abscess of buttock: Secondary | ICD-10-CM

## 2018-03-18 MED ORDER — SULFAMETHOXAZOLE-TRIMETHOPRIM 800-160 MG PO TABS: 1.0000 | ORAL_TABLET | Freq: Two times a day (BID) | ORAL | 0 refills | Status: DC

## 2018-03-18 NOTE — Telephone Encounter (Signed)
Pt stated she does not have a fever, she stated she has an abscess in her nose. She popped it yesterday to release the swelling but now it has gotten bigger and red. She stated she feels sick including nausea, headache and feelings of being hot and cold. Please advise patient.  She stated she will not be able to come in today because her dog has to bet at the vet so she can come in on Monday if nothing can be sent to the pharmacy for her.   Noted that patient has been using the topical ointment that you previously prescribed.

## 2018-03-18 NOTE — Telephone Encounter (Signed)
Please call and find out if she has a fever? Abscess? Feeling sick? Does she have the mupirocin ointment and has she used it? Please get more information to determine if she needs to be seen or if she is just wanting the topical medication. If she thinks she needs an oral antibiotic then she should be seen. Thanks

## 2018-03-18 NOTE — Telephone Encounter (Signed)
Called stating she has MRSA infection in her nose. Same as she has had in the past. Wants to know if she will have to come in or if something can be sent to the pharmacy for her. Please advise patient.

## 2018-03-18 NOTE — Telephone Encounter (Signed)
Please call and let her know that I will send in Bactrim as this seemed to work for her last abscess. The nose is a tough spot to have an abscess and can get much worse quickly. If this happens over the weekend she will have to go to an urgent care or ER. Have her take a picture of her nose, this will be helpful to compare and have her come in to be seen on Monday.

## 2018-03-18 NOTE — Telephone Encounter (Signed)
Pt has been informed that bactim has been sent to her pharmacy and patient has scheduled her appointment.

## 2018-03-21 ENCOUNTER — Encounter: Payer: Self-pay | Admitting: Family Medicine

## 2018-03-21 ENCOUNTER — Ambulatory Visit: Payer: 59 | Admitting: Family Medicine

## 2018-03-21 VITALS — BP 110/80 | HR 70 | Temp 97.9°F | Ht 64.75 in | Wt 140.8 lb

## 2018-03-21 DIAGNOSIS — Z8614 Personal history of Methicillin resistant Staphylococcus aureus infection: Secondary | ICD-10-CM | POA: Diagnosis not present

## 2018-03-21 DIAGNOSIS — J340 Abscess, furuncle and carbuncle of nose: Secondary | ICD-10-CM | POA: Diagnosis not present

## 2018-03-21 DIAGNOSIS — R5383 Other fatigue: Secondary | ICD-10-CM | POA: Diagnosis not present

## 2018-03-21 MED ORDER — MUPIROCIN 2 % EX OINT: 1.0000 "application " | TOPICAL_OINTMENT | Freq: Two times a day (BID) | CUTANEOUS | 0 refills | Status: DC

## 2018-03-21 MED ORDER — CHLORHEXIDINE GLUCONATE 4 % EX LIQD: Freq: Every day | CUTANEOUS | 0 refills | Status: DC | PRN

## 2018-03-21 MED ORDER — SULFAMETHOXAZOLE-TRIMETHOPRIM 800-160 MG PO TABS: 1.0000 | ORAL_TABLET | Freq: Two times a day (BID) | ORAL | 0 refills | Status: DC

## 2018-03-21 NOTE — Progress Notes (Signed)
Subjective:    Patient ID: Kelsey Sellers, female    DOB: 1971-06-14, 47 y.o.   MRN: [id number]  HPI Chief Complaint  Patient presents with  . Abscess    right nostril x4 days not improving   She is a 47 year old Caucasian female with a history of MRSA.  Here with complaints of a 4 day history of abscess to her right nare. Started on Bactrim 3 days ago.  States the area was draining pus yesterday and this morning.  Currently has a scab.  States she is also been using mupirocin. She switched her soap to Dial at her previous visit in May.  Also complains of generalized fatigue, loss of appetite and feeling hot and cold.  Denies fever, chest pain, palpitations, abdominal pain, nausea, vomiting, diarrhea.  No rhinorrhea, Nasal congestion, ear pain, sinus pressure or pain.  Recent abscess in May in her groin area.  This responded well to Bactrim. States she has had 4 abscesses over the past couple of years.  She is concerned as to why she is starting to have these.   States her husband has a history of staph infections but no MRSA.  She has several pets at home. She would like to have blood work to assess her immune status.  States she is going out of the [place] to [place] this weekend.  Reviewed allergies, medications, past medical, surgical, family, and social history.    Review of Systems Pertinent positives and negatives in the history of present illness.     Objective:   Physical Exam  Constitutional: She is oriented to person, place, and time. She appears well-developed and well-nourished. No distress.  HENT:  Nose: Sinus tenderness present. Right sinus exhibits no maxillary sinus tenderness and no frontal sinus tenderness. Left sinus exhibits no maxillary sinus tenderness and no frontal sinus tenderness.    Mouth/Throat: Uvula is midline, oropharynx is clear and moist and mucous membranes are normal.  Erythema, edema and purulent drainage inside her right  nare laterally with slight erythema to her external right nare. Normal right nare otherwise, patent.   Eyes: Pupils are equal, round, and reactive to light. Conjunctivae and lids are normal.  Neck: Normal range of motion. Neck supple.  Cardiovascular: Normal rate, regular rhythm and normal heart sounds.  Pulmonary/Chest: Effort normal and breath sounds normal.  Lymphadenopathy:    She has no cervical adenopathy.  Neurological: She is alert and oriented to person, place, and time. No cranial nerve deficit or sensory deficit.  Skin: Skin is warm and dry. No pallor.   BP 110/80   Pulse 70   Temp 97.9 F (36.6 C) (Oral)   Ht 5' 4.75" (1.645 m)   Wt 140 lb 12.8 oz (63.9 kg)   SpO2 99%   BMI 23.61 kg/m        Assessment & Plan:  Abscess of nose - Plan: CBC with Differential/Platelet, Comprehensive metabolic panel, chlorhexidine (HIBICLENS) 4 % external liquid, sulfamethoxazole-trimethoprim (BACTRIM DS,SEPTRA DS) 800-160 MG tablet, mupirocin ointment (BACTROBAN) 2 %  History of MRSA infection - Plan: HIV antibody, chlorhexidine (HIBICLENS) 4 % external liquid  Other fatigue - Plan: CBC with Differential/Platelet, Comprehensive metabolic panel, TSH, HIV antibody  On the scab on her right nare and was able to expel exudate. Encouarged her to use warm compresses and keep Bactroban on the area. Prefer to keep it draining as long as we can. No sign of infection spreading or tunneling at this time.  She  does have some generalized fatigue and malaise. Will have her continue on Bactrim, use the topical Bactroban and check labs to assess immune status. She is going out of the country next week so I will refill her antibiotic and have her take this with her in case she needs to extend the dose. Advised to avoid getting into public water and to avoid having her head under water at all.  Counseling done on decolonization of MRSA with bleach soaks and Hibiclens.  Follow up if symptoms worsen or pending  labs.

## 2018-03-21 NOTE — Patient Instructions (Signed)
Use warm compresses often, topical Bactroban as prescribed and complete the antibiotic.  Use 1/4 cup of bleach in a tub of water and soak for 15 minutes once weekly.  Use the Hibiclens in the shower. Lather up for 5 minutes and then rinse completely.  Do this for 5 days at first then twice weekly.      FAQs about MRSA (Methicillin-Resistant Staphylococcus aureus) What is MRSA? Staphylococcus aureus (pronounced staff-ill-oh-KOK-us AW-ree-us), or "Staph" is a very common germ that about 1 out of every 3 people have on their skin or in their nose. This germ does not cause any problems for most people who have it on their skin. But sometimes it can cause serious infections such as skin or wound infections, pneumonia, or infections of the blood. Antibiotics are given to kill Staph germs when they cause infections. Some Staph are resistant, meaning they cannot be killed by some antibiotics. "Methicillin-resistant Staphylococcus aureus" or "MRSA" is a type of Staph that is resistant to some of the antibiotics that are often used to treat Staph infections. Who is most likely to get an MRSA infection? In the hospital, people who are more likely to get an MRSA infection are people who:  have other health conditions making them sick  have been in the hospital or a nursing home  have been treated with antibiotics.  People who are healthy and who have not been in the hospital or a nursing home can also get MRSA infections. These infections usually involve the skin. More information about this type of MRSA infection, known as "community-associated MRSA" infection, is available from the Centers for Disease Control and Prevention (CDC). ReportNation.uyhttp://www.cdc.gov/mrsa How do I get an MRSA infection? People who have MRSA germs on their skin or who are infected with MRSA may be able to spread the germ to other people. MRSA can be passed on to bed linens, bed rails, bathroom fixtures, and medical equipment. It can  spread to other people on contaminated equipment and on the hands of doctors, nurses, other healthcare providers and visitors. Can MRSA infections be treated? Yes, there are antibiotics that can kill MRSA germs. Some patients with MRSA abscesses may need surgery to drain the infection. Your healthcare provider will determine which treatments are best for you. What are some of the things that hospitals are doing to prevent MRSA infections? To prevent MRSA infections, doctors, nurses and other healthcare providers:  Clean their hands with soap and water or an alcohol-based hand rub before and after caring for every patient.  Carefully clean hospital rooms and medical equipment.  Use Contact Precautions when caring for patients with MRSA. Contact Precautions mean: ? Whenever possible, patients with MRSA will have a single room or will share a room only with someone else who also has MRSA. ? Healthcare providers will put on gloves and wear a gown over their clothing while taking care of patients with MRSA. ? Visitors may also be asked to wear a gown and gloves. ? When leaving the room, hospital providers and visitors remove their gown and gloves and clean their hands. ? Patients on Contact Precautions are asked to stay in their hospital rooms as much as possible. They should not go to common areas, such as the gift shop or cafeteria. They may go to other areas of the hospital for treatments and tests.  May test some patients to see if they have MRSA on their skin. This test involves rubbing a cotton-tipped swab in the patient's nostrils or  on the skin.  What can I do to help prevent MRSA infections? In the hospital  Make sure that all doctors, nurses, and other healthcare providers clean their hands with soap and water or an alcohol-based hand rub before and after caring for you.  If you do not see your providers clean their hands, please ask them to do so. When you go home  If you have wounds  or an intravascular device (such as a catheter or dialysis port) make sure that you know how to take care of them.  Can my friends and family get MRSA when they visit me? The chance of getting MRSA while visiting a person who has MRSA is very low. To decrease the chance of getting MRSA your family and friends should:  Clean their hands before they enter your room and when they leave.  Ask a healthcare provider if they need to wear protective gowns and gloves when they visit you.  What do I need to do when I go home from the hospital? To prevent another MRSA infection and to prevent spreading MRSA to others:  Keep taking any antibiotics prescribed by your doctor. Don't take half-doses or stop before you complete your prescribed course.  Clean your hands often, especially before and after changing your wound dressing or bandage.  People who live with you should clean their hands often as well.  Keep any wounds clean and change bandages as instructed until healed.  Avoid sharing personal items such as towels or razors.  Wash and dry your clothes and bed linens in the warmest temperatures recommended on the labels.  Tell your healthcare providers that you have MRSA. This includes home health nurses and aides, therapists, and personnel in doctors' offices.  Your doctor may have more instructions for you.  If you have questions, please ask your doctor or nurse. Co-sponsored by The Society for Healthcare Epidemiology of [place] ([phone] [phone]); Infectious Diseases Society of America (IDSA); [facility] Association; Association for Professionals in Infection Control and Epidemiology (APIC); Centers for Disease Control and Prevention (CDC); and [employer]. This information is not intended to replace advice given to you by your health care provider. Make sure you discuss any questions you have with your health care provider. Document Released: 09/19/2013 Document Revised: 02/20/2016  Document Reviewed: 11/28/2014 Elsevier Interactive Patient Education  [employer].

## 2018-03-22 LAB — CBC WITH DIFFERENTIAL/PLATELET
BASOS ABS: 0 10*3/uL (ref 0.0–0.2)
BASOS: 1 %
EOS (ABSOLUTE): 0.2 10*3/uL (ref 0.0–0.4)
Eos: 2 %
HEMOGLOBIN: 14.2 g/dL (ref 11.1–15.9)
Hematocrit: 41.9 % (ref 34.0–46.6)
IMMATURE GRANS (ABS): 0 10*3/uL (ref 0.0–0.1)
IMMATURE GRANULOCYTES: 0 %
LYMPHS: 31 %
Lymphocytes Absolute: 2.4 10*3/uL (ref 0.7–3.1)
MCH: 31.3 pg (ref 26.6–33.0)
MCHC: 33.9 g/dL (ref 31.5–35.7)
MCV: 93 fL (ref 79–97)
MONOCYTES: 8 %
Monocytes Absolute: 0.6 10*3/uL (ref 0.1–0.9)
NEUTROS ABS: 4.5 10*3/uL (ref 1.4–7.0)
NEUTROS PCT: 58 %
PLATELETS: 307 10*3/uL (ref 150–450)
RBC: 4.53 x10E6/uL (ref 3.77–5.28)
RDW: 12.7 % (ref 12.3–15.4)
WBC: 7.7 10*3/uL (ref 3.4–10.8)

## 2018-03-22 LAB — COMPREHENSIVE METABOLIC PANEL
ALBUMIN: 4.4 g/dL (ref 3.5–5.5)
ALT: 14 IU/L (ref 0–32)
AST: 15 IU/L (ref 0–40)
Albumin/Globulin Ratio: 1.7 (ref 1.2–2.2)
Alkaline Phosphatase: 50 IU/L (ref 39–117)
BUN / CREAT RATIO: 12 (ref 9–23)
BUN: 11 mg/dL (ref 6–24)
Bilirubin Total: 0.2 mg/dL (ref 0.0–1.2)
CALCIUM: 9.1 mg/dL (ref 8.7–10.2)
CO2: 19 mmol/L — ABNORMAL LOW (ref 20–29)
Chloride: 104 mmol/L (ref 96–106)
Creatinine, Ser: 0.95 mg/dL (ref 0.57–1.00)
GFR calc non Af Amer: 73 mL/min/{1.73_m2} (ref >59)
GFR, EST AFRICAN AMERICAN: 84 mL/min/{1.73_m2} (ref >59)
GLUCOSE: 89 mg/dL (ref 65–99)
Globulin, Total: 2.6 g/dL (ref 1.5–4.5)
Potassium: 4.3 mmol/L (ref 3.5–5.2)
Sodium: 134 mmol/L (ref 134–144)
TOTAL PROTEIN: 7 g/dL (ref 6.0–8.5)

## 2018-03-22 LAB — TSH: TSH: 1.68 u[IU]/mL (ref 0.450–4.500)

## 2018-03-22 LAB — HIV ANTIBODY (ROUTINE TESTING W REFLEX): HIV Screen 4th Generation wRfx: NONREACTIVE

## 2018-07-20 ENCOUNTER — Ambulatory Visit: Payer: 59 | Admitting: Family Medicine

## 2018-07-20 ENCOUNTER — Encounter: Payer: Self-pay | Admitting: Family Medicine

## 2018-07-20 VITALS — BP 124/80 | HR 62 | Wt 140.2 lb

## 2018-07-20 DIAGNOSIS — R41840 Attention and concentration deficit: Secondary | ICD-10-CM

## 2018-07-20 DIAGNOSIS — Z23 Encounter for immunization: Secondary | ICD-10-CM | POA: Diagnosis not present

## 2018-07-20 NOTE — Patient Instructions (Signed)
I am referring you to [facility] for further evaluation and treatment if needed

## 2018-07-20 NOTE — Progress Notes (Signed)
   Subjective:    Patient ID: Kelsey Sellers, female    DOB: January 26, 1971, 47 y.o.   MRN: [id number]  HPI Chief Complaint  Patient presents with  . possible ADHD.    possible ADHD, trouble concentrating. dealing more with in the last year. not dx before   She is here with complaints of difficulty concentrating over the past year.  She recalls issues with concentrating as far back as her teenage years.  Denies history of ADHD. States her boss and family members tell her she often drifts off. She is taking new classes for her career and is having a tough time retaining new material.  This is affecting her personal and professional life.   States she is taking a 21 day course but it has been 6 weeks and she is only halfway through the course.  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  Recalls having good grades in middle school, high school.   Brief history of antidepressants when going through a divorce.  Otherwise she denies history of anxiety or depression  Drinks alcohol socially.  Denies drug use.  No history of addiction.   Denies fever, chills, dizziness, chest pain, palpitations, shortness of breath, abdominal pain, N/V/D, urinary symptoms.   Reviewed allergies, medications, past medical, surgical, family, and social history.    Review of Systems Pertinent positives and negatives in the history of present illness.     Objective:   Physical Exam BP 124/80   Pulse 62   Wt 140 lb 3.2 oz (63.6 kg)   BMI 23.51 kg/m   Alert and oriented and in no acute distress.  Not otherwise examined.      Assessment & Plan:  Difficulty concentrating  Needs flu shot - Plan: Flu Vaccine QUAD 36+ mos IM  Plan to send her to [facility] for further evaluation of concentration and focus issues. This is now affecting her professional life, home life and causing her more difficulty.

## 2018-08-03 ENCOUNTER — Telehealth: Payer: Self-pay | Admitting: Internal Medicine

## 2018-08-03 NOTE — Telephone Encounter (Signed)
Pt called and left a voicemail stating that she has had a lot of breakouts of mrsa. She had one back in July and you gave her sulfa med. She took it and then it came back and you gave her more sulfa but it went away so she ended not taking the med. She recently had a breakout again with mrsa so she had sulfa med and took that and last night she broke out in hives and tongue swelled up. She talk to pharmacist this morning and the pharmacist states she could have developed an allergy to sulfa. Pt called wanting to know if you would send in another antibiotic for her MRSA. She knows what MRSA is and what it looks like so that's why she has not come in. Please advise if you have to see her first.

## 2018-08-03 NOTE — Telephone Encounter (Signed)
Let's have her come in so we can discuss all of this and I can evaluate her current infection. We may need to take another step in regards to preventing flares.

## 2018-08-03 NOTE — Telephone Encounter (Signed)
Pt coming in tomorrow

## 2018-08-04 ENCOUNTER — Encounter: Payer: Self-pay | Admitting: Family Medicine

## 2018-08-04 ENCOUNTER — Ambulatory Visit: Payer: 59 | Admitting: Family Medicine

## 2018-08-04 VITALS — BP 110/70 | HR 77 | Temp 98.3°F | Resp 16 | Wt 141.8 lb

## 2018-08-04 DIAGNOSIS — L0231 Cutaneous abscess of buttock: Secondary | ICD-10-CM

## 2018-08-04 DIAGNOSIS — Z8614 Personal history of Methicillin resistant Staphylococcus aureus infection: Secondary | ICD-10-CM | POA: Diagnosis not present

## 2018-08-04 MED ORDER — MUPIROCIN 2 % EX OINT: 1.0000 "application " | TOPICAL_OINTMENT | Freq: Two times a day (BID) | CUTANEOUS | 0 refills | Status: AC

## 2018-08-04 MED ORDER — CHLORHEXIDINE GLUCONATE 4 % EX LIQD: Freq: Every day | CUTANEOUS | 1 refills | Status: AC | PRN

## 2018-08-04 MED ORDER — DOXYCYCLINE HYCLATE 100 MG PO TABS: 100.0000 mg | ORAL_TABLET | Freq: Two times a day (BID) | ORAL | 0 refills | Status: AC

## 2018-08-04 NOTE — Progress Notes (Signed)
   Subjective:    Patient ID: Kelsey Sellers, female    DOB: 05-May-1971, 47 y.o.   MRN: [id number]  HPI Chief Complaint  Patient presents with  . MRSA    MRSA flare up. buttcheek drainage. started saturday night.    She is here with complaints of a 5 day history of gradually improving abscess. This one is on her left buttock. She has not had one in this location previously. Denies fever, chills, nausea, vomiting however she does report feeling sickly with body aches prior to development of abscess.  States this is her 5th abscess over the past year or so.   History of MRSA. She was using Hibiclens and bleach baths 1-2 times weekly but stopped these a couple of months ago. Using Dial soap.   States last time she took Bactrim she had tongue swelling. New allergy.   New diagnosis of ADHD and started on medication. She is encouraged that this will help stress level.   Reviewed allergies, medications, past medical, surgical, family, and social history.    Review of Systems Pertinent positives and negatives in the history of present illness.     Objective:   Physical Exam BP 110/70   Pulse 77   Temp 98.3 F (36.8 C) (Oral)   Resp 16   Wt 141 lb 12.8 oz (64.3 kg)   SpO2 98%   BMI 23.78 kg/m   Alert and oriented and in no acute distress. Normal pharyngeal exam. Heart exam shows RRR, lungs CTA and normal work of breathing.  Left buttock with a 0.5 cm area of firmness and yellowish discharge expelled. No fluctuance or surrounding erythema. Skin is warm and dry.       Assessment & Plan:  Abscess of buttock, left - Plan: doxycycline (VIBRA-TABS) 100 MG tablet, WOUND CULTURE, mupirocin ointment (BACTROBAN) 2 %, chlorhexidine (HIBICLENS) 4 % external liquid  History of MRSA infection - Plan: doxycycline (VIBRA-TABS) 100 MG tablet, WOUND CULTURE, mupirocin ointment (BACTROBAN) 2 %, chlorhexidine (HIBICLENS) 4 % external liquid  Wound culture done.  Doxycycline prescribed. Previous  wound culture was sensitive.  Advised her to use the Hibiclens wash and bleach baths weekly. Mupirocin prescribed as well. Continue using Dial soap.  Starting on Vyvanse for new diagnosis of ADHD and thinks this will improve her stress.  Consider referral to ID.

## 2018-08-06 LAB — WOUND CULTURE: Organism ID, Bacteria: NONE SEEN

## 2018-10-05 ENCOUNTER — Other Ambulatory Visit: Payer: Self-pay | Admitting: Family Medicine

## 2018-10-05 NOTE — Telephone Encounter (Signed)
Is this ok to refill?  

## 2018-10-05 NOTE — Telephone Encounter (Signed)
Ok to refill but only 1. If she is needing this often then we need to have her come in.

## 2018-10-06 NOTE — Telephone Encounter (Signed)
Pt states she uses it more often in winter time with asthma. She was advised I will refill it for this time but if she needs to be seen
